# Patient Record
Sex: Female | Born: 1995 | Race: Black or African American | Hispanic: No | Marital: Single | Smoking: Never smoker
Health system: Southern US, Community
[De-identification: ages and names within clinical notes are randomized; demographics above are authoritative.]

---
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
## 2014-01-11 ENCOUNTER — Ambulatory Visit (INDEPENDENT_AMBULATORY_CARE_PROVIDER_SITE_OTHER): Payer: BC Managed Care – PPO | Admitting: Physician Assistant

## 2014-01-11 ENCOUNTER — Encounter: Payer: Self-pay | Admitting: Physician Assistant

## 2014-01-11 VITALS — BP 100/60 | HR 67 | Temp 98.3°F | Resp 18 | Wt 127.1 lb

## 2014-01-11 DIAGNOSIS — J209 Acute bronchitis, unspecified: Secondary | ICD-10-CM

## 2014-01-11 MED ORDER — HYDROCODONE-HOMATROPINE 5-1.5 MG/5ML PO SYRP: 5.0000 mL | ORAL_SOLUTION | Freq: Every evening | ORAL | Status: DC | PRN

## 2014-01-11 NOTE — Progress Notes (Signed)
Subjective:    Patient ID: Andrea Matthews, female    DOB: 07/26/1995, 18 y.o.   MRN: [id number]  URI  This is a new problem. The current episode started in the past 7 days (6 days). The problem has been gradually worsening. There has been no fever. Associated symptoms include congestion, coughing (every few minutes, sputum.), nausea (from mucus per pt.) and rhinorrhea. Pertinent negatives include no abdominal pain, chest pain, diarrhea, dysuria, ear pain, headaches, joint pain, joint swelling, neck pain, plugged ear sensation, rash, sinus pain, sneezing, sore throat, swollen glands, vomiting or wheezing. Treatments tried: cough drops, alkaseltzer, multisymptom cold medicine. The treatment provided no relief.      Review of Systems  Constitutional: Negative for fever and chills.  HENT: Positive for congestion and rhinorrhea. Negative for ear pain, postnasal drip, sinus pressure, sneezing and sore throat.   Respiratory: Positive for cough (every few minutes, sputum.). Negative for shortness of breath and wheezing.   Cardiovascular: Negative for chest pain.  Gastrointestinal: Positive for nausea (from mucus per pt.). Negative for vomiting, abdominal pain and diarrhea.  Genitourinary: Negative for dysuria.  Musculoskeletal: Negative for joint pain and neck pain.  Skin: Negative for rash.  Neurological: Negative for syncope and headaches.  All other systems reviewed and are negative.    History reviewed. No pertinent past medical history.  History   Social History  . Marital Status: Single    Spouse Name: N/A    Number of Children: N/A  . Years of Education: N/A   Occupational History  . Not on file.   Social History Main Topics  . Smoking status: Never Smoker   . Smokeless tobacco: Not on file  . Alcohol Use: No  . Drug Use: Not on file  . Sexual Activity: Not on file   Other Topics Concern  . Not on file   Social History Narrative  . No narrative on file    History  reviewed. No pertinent past surgical history.  No family history on file.  No Known Allergies  Current Outpatient Prescriptions on File Prior to Visit  Medication Sig Dispense Refill  . benzonatate (TESSALON) 200 MG capsule Take 1 capsule (200 mg total) by mouth 3 (three) times daily as needed for cough.  30 capsule  0   Current Facility-Administered Medications on File Prior to Visit  Medication Dose Route Frequency Provider Last Rate Last Dose  . meningococcal polysaccharide (MENACTRA) injection 0.5 mL  0.5 mL Intramuscular Once Kristian CoveyBruce W Burchette, MD        EXAM: BP 100/60  Pulse 67  Temp(Src) 98.3 F (36.8 C) (Oral)  Resp 18  Wt 127 lb 1.6 oz (57.652 kg)  SpO2 96%     Objective:   Physical Exam  Nursing note and vitals reviewed. Constitutional: She is oriented to person, place, and time. She appears well-developed and well-nourished. No distress.  HENT:  Head: Normocephalic and atraumatic.  Right Ear: External ear normal.  Left Ear: External ear normal.  Nose: Nose normal.  Mouth/Throat: No oropharyngeal exudate.  Oropharynx is slightly erythematous, no exudate. Bilateral TMs normal. Bilateral frontal and maxillary sinuses non-TTP.  Eyes: Conjunctivae and EOM are normal. Pupils are equal, round, and reactive to light.  Neck: Normal range of motion. Neck supple.  Cardiovascular: Normal rate, regular rhythm and intact distal pulses.   Pulmonary/Chest: Effort normal and breath sounds normal. No stridor. No respiratory distress. She has no wheezes. She has no rales. She exhibits no tenderness.  Lungs  CTAB  Lymphadenopathy:    She has no cervical adenopathy.  Neurological: She is alert and oriented to person, place, and time.  Skin: Skin is warm and dry. She is not diaphoretic. No pallor.  Psychiatric: She has a normal mood and affect. Her behavior is normal. Judgment and thought content normal.     Lab Results  Component Value Date   WBC 8.3 05/26/2011   HGB 12.9  05/26/2011   HCT 40.4 05/26/2011   PLT 203.0 05/26/2011   GLUCOSE 82 05/26/2011   ALT <6 06/25/2008   AST 28 06/25/2008   NA 135 05/26/2011   K 4.6 05/26/2011   CL 101 05/26/2011   CREATININE 0.7 05/26/2011   BUN 18 05/26/2011   CO2 26 05/26/2011        Assessment & Plan:  Andrea Matthews was seen today for cough.  Diagnoses and associated orders for this visit:  Acute bronchitis, unspecified organism Comments: Trial of hycodan for cough at night. OTC mucinex, nasal steroid, antihistamine, rest, push fluids, watchful waiting. - HYDROcodone-homatropine (HYCODAN) 5-1.5 MG/5ML syrup; Take 5 mLs by mouth at bedtime as needed for cough (Do not drive while taking this medication as it will inhibit your ability to operate a motor vehicle.).    Return precautions provided, and patient handout on bronchitis.  Plan to follow up as needed, or for worsening or persistent symptoms despite treatment.  Patient Instructions  Hycodan at bedtime as needed for cough. Do not drive while taking this medication as it will inhibit your ability to operate a motor vehicle.   Plain Over the Counter Mucinex (NOT Mucinex D) for thick secretions  Force NON dairy fluids, drinking plenty of water is best.    Over the Counter Flonase OR Nasacort AQ 1 spray in each nostril twice a day as needed. Use the "crossover" technique into opposite nostril spraying toward opposite ear @ 45 degree angle, not straight up into nostril.   Plain Over the Counter Allegra (NOT D )  160 daily , OR Loratidine 10 mg , OR Zyrtec 10 mg @ bedtime  as needed for itchy eyes & sneezing.  Saline Irrigation and Saline Sprays can also help reduce symptoms.  If emergency symptoms discussed during visit developed, seek medical attention immediately.  Followup as needed, or for worsening or persistent symptoms despite treatment.

## 2014-01-11 NOTE — Progress Notes (Signed)
Pre visit review using our clinic review tool, if applicable. No additional management support is needed unless otherwise documented below in the visit note. 

## 2014-01-11 NOTE — Patient Instructions (Addendum)
Hycodan at bedtime as needed for cough. Do not drive while taking this medication as it will inhibit your ability to operate a motor vehicle.   Plain Over the Counter Mucinex (NOT Mucinex D) for thick secretions  Force NON dairy fluids, drinking plenty of water is best.    Over the Counter Flonase OR Nasacort AQ 1 spray in each nostril twice a day as needed. Use the "crossover" technique into opposite nostril spraying toward opposite ear @ 45 degree angle, not straight up into nostril.   Plain Over the Counter Allegra (NOT D )  160 daily , OR Loratidine 10 mg , OR Zyrtec 10 mg @ bedtime  as needed for itchy eyes & sneezing.  Saline Irrigation and Saline Sprays can also help reduce symptoms.  If emergency symptoms discussed during visit developed, seek medical attention immediately.  Followup as needed, or for worsening or persistent symptoms despite treatment.    Acute Bronchitis Bronchitis is when the airways that extend from the windpipe into the lungs get red, puffy, and painful (inflamed). Bronchitis often causes thick spit (mucus) to develop. This leads to a cough. A cough is the most common symptom of bronchitis. In acute bronchitis, the condition usually begins suddenly and goes away over time (usually in 2 weeks). Smoking, allergies, and asthma can make bronchitis worse. Repeated episodes of bronchitis may cause more lung problems. HOME CARE  Rest.  Drink enough fluids to keep your pee (urine) clear or pale yellow (unless you need to limit fluids as told by your doctor).  Only take over-the-counter or prescription medicines as told by your doctor.  Avoid smoking and secondhand smoke. These can make bronchitis worse. If you are a smoker, think about using nicotine gum or skin patches. Quitting smoking will help your lungs heal faster.  Reduce the chance of getting bronchitis again by:  Washing your hands often.  Avoiding people with cold symptoms.  Trying not to touch your  hands to your mouth, nose, or eyes.  Follow up with your doctor as told. GET HELP IF: Your symptoms do not improve after 1 week of treatment. Symptoms include:  Cough.  Fever.  Coughing up thick spit.  Body aches.  Chest congestion.  Chills.  Shortness of breath.  Sore throat. GET HELP RIGHT AWAY IF:   You have an increased fever.  You have chills.  You have severe shortness of breath.  You have bloody thick spit (sputum).  You throw up (vomit) often.  You lose too much body fluid (dehydration).  You have a severe headache.  You faint. MAKE SURE YOU:   Understand these instructions.  Will watch your condition.  Will get help right away if you are not doing well or get worse. Document Released: 09/01/2007 Document Revised: 11/15/2012 Document Reviewed: 09/05/2012 [facility] Patient Information 2015 [employer] [place]. This information is not intended to replace advice given to you by your health care provider. Make sure you discuss any questions you have with your health care provider.

## 2014-08-23 ENCOUNTER — Ambulatory Visit (INDEPENDENT_AMBULATORY_CARE_PROVIDER_SITE_OTHER): Payer: BC Managed Care – PPO | Admitting: Family Medicine

## 2014-08-23 ENCOUNTER — Ambulatory Visit: Payer: Self-pay | Admitting: Family Medicine

## 2014-08-23 ENCOUNTER — Encounter: Payer: Self-pay | Admitting: Family Medicine

## 2014-08-23 VITALS — BP 110/70 | HR 82 | Temp 98.2°F | Wt 126.0 lb

## 2014-08-23 DIAGNOSIS — H6011 Cellulitis of right external ear: Secondary | ICD-10-CM

## 2014-08-23 MED ORDER — CEPHALEXIN 500 MG PO CAPS: 500.0000 mg | ORAL_CAPSULE | Freq: Three times a day (TID) | ORAL | Status: AC

## 2014-08-23 NOTE — Patient Instructions (Signed)
Follow up for any fever, or increasing redness or swelling.

## 2014-08-23 NOTE — Progress Notes (Signed)
Pre visit review using our clinic review tool, if applicable. No additional management support is needed unless otherwise documented below in the visit note. 

## 2014-08-23 NOTE — Progress Notes (Signed)
   Subjective:    Patient ID: Andrea Matthews, female    DOB: 1996/01/26, 19 y.o.   MRN: [id number]  HPI  Patient seen with 5 day history of some redness right outer ear. She noticed a few bumps behind the ear but no vesicles. No pustules. No history of injury. Denies any fevers or chills. No alleviating or exacerbating factors. No arthralgias.  No hx of polychondritis.   Review of Systems  Constitutional: Negative for fever and chills.  Musculoskeletal: Negative for arthralgias.  Hematological: Positive for adenopathy.       Objective:   Physical Exam  Constitutional: She appears well-developed and well-nourished.  HENT:  Right pinna is erythematous and slightly warm to touch but no visible swelling. No visible vesicles or pustules. No fluctuance. She does not have any erythema extending down into the ear canal. Posterior aspect of right ear is normal.  Neck: Neck supple.  She has a couple of tender posterior cervical nodes  Cardiovascular: Normal rate and regular rhythm.           Assessment & Plan:  Cellulitis right ear.  She does not have any involvement of the canals to suggest otitis externa. No cartilage swelling to suggest polychondritis.  No obvious breaks in the skin. Suspect reactive adenopathy right neck. Keflex 500 mg 3 times a day for 10 days. Follow-up promptly for any fever or increased redness or swelling
